# Patient Record
Sex: Male | Born: 2000
Health system: Southern US, Community
[De-identification: ages and names within clinical notes are randomized; demographics above are authoritative.]

## PROBLEM LIST (undated history)

## (undated) DIAGNOSIS — E119 Type 2 diabetes mellitus without complications: Secondary | ICD-10-CM

## (undated) HISTORY — DX: Type 2 diabetes mellitus without complications: E11.9

---
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
## 2020-04-09 ENCOUNTER — Other Ambulatory Visit: Payer: Self-pay

## 2020-04-10 ENCOUNTER — Encounter: Payer: Self-pay | Admitting: Family

## 2020-04-10 ENCOUNTER — Ambulatory Visit (INDEPENDENT_AMBULATORY_CARE_PROVIDER_SITE_OTHER): Payer: No Typology Code available for payment source | Admitting: Family

## 2020-04-10 DIAGNOSIS — Z833 Family history of diabetes mellitus: Secondary | ICD-10-CM | POA: Diagnosis not present

## 2020-04-10 DIAGNOSIS — E669 Obesity, unspecified: Secondary | ICD-10-CM

## 2020-04-10 DIAGNOSIS — Z0000 Encounter for general adult medical examination without abnormal findings: Secondary | ICD-10-CM | POA: Diagnosis not present

## 2020-04-10 DIAGNOSIS — Z23 Encounter for immunization: Secondary | ICD-10-CM

## 2020-04-10 LAB — CBC WITH DIFFERENTIAL/PLATELET
Basophils Absolute: 0 10*3/uL (ref 0.0–0.1)
Basophils Relative: 0.4 % (ref 0.0–3.0)
Eosinophils Absolute: 0.1 10*3/uL (ref 0.0–0.7)
Eosinophils Relative: 1 % (ref 0.0–5.0)
HCT: 44.5 % (ref 36.0–49.0)
Hemoglobin: 15.5 g/dL (ref 12.0–16.0)
Lymphocytes Relative: 23.9 % — ABNORMAL LOW (ref 24.0–48.0)
Lymphs Abs: 2.3 10*3/uL (ref 0.7–4.0)
MCHC: 34.8 g/dL (ref 31.0–37.0)
MCV: 89.2 fl (ref 78.0–98.0)
Monocytes Absolute: 0.7 10*3/uL (ref 0.1–1.0)
Monocytes Relative: 7.6 % (ref 3.0–12.0)
Neutro Abs: 6.5 10*3/uL (ref 1.4–7.7)
Neutrophils Relative %: 67.1 % (ref 43.0–71.0)
Platelets: 302 10*3/uL (ref 150.0–575.0)
RBC: 4.99 Mil/uL (ref 3.80–5.70)
RDW: 13.9 % (ref 11.4–15.5)
WBC: 9.6 10*3/uL (ref 4.5–13.5)

## 2020-04-10 LAB — POCT URINALYSIS DIPSTICK
Bilirubin, UA: NEGATIVE
Blood, UA: POSITIVE
Glucose, UA: NEGATIVE
Ketones, UA: NEGATIVE
Leukocytes, UA: NEGATIVE
Nitrite, UA: NEGATIVE
Protein, UA: POSITIVE — AB
Spec Grav, UA: 1.025 (ref 1.010–1.025)
Urobilinogen, UA: 0.2 E.U./dL
pH, UA: 6 (ref 5.0–8.0)

## 2020-04-10 NOTE — Progress Notes (Signed)
Established Patient Office Visit  Subjective:  Patient ID: Paul Gibson, male    DOB: Mar 22, 2001  Age: 19 y.o. MRN: [id number]  CC: No chief complaint on file.   HPI Paul Gibson presents for male wellness exam. Denies any concerns. He works for a [employer]. Not sexually active. Has a family history of obesity and DM. COVID vaccine received. Does not routinely exercise  Past Medical History:  Diagnosis Date  . Diabetes mellitus without complication [facility])    mother    History reviewed. No pertinent surgical history.  Family History  Problem Relation Age of Onset  . Diabetes Mother   . Cancer Father        skin cancer  . Diabetes Brother   . Diabetes Maternal Grandmother   . Diabetes Maternal Grandfather     Social History   Socioeconomic History  . Marital status: Single    Spouse name: Not on file  . Number of children: Not on file  . Years of education: Not on file  . Highest education level: Not on file  Occupational History  . Not on file  Tobacco Use  . Smoking status: Never Smoker  . Smokeless tobacco: Never Used  Vaping Use  . Vaping Use: Never used  Substance and Sexual Activity  . Alcohol use: Never  . Drug use: Never  . Sexual activity: Never  Other Topics Concern  . Not on file  Social History Narrative  . Not on file   Social Determinants of Health   Financial Resource Strain:   . Difficulty of Paying Living Expenses: Not on file  Food Insecurity:   . Worried About Programme researcher, broadcasting/film/video in the Last Year: Not on file  . Ran Out of Food in the Last Year: Not on file  Transportation Needs:   . Lack of Transportation (Medical): Not on file  . Lack of Transportation (Non-Medical): Not on file  Physical Activity:   . Days of Exercise per Week: Not on file  . Minutes of Exercise per Session: Not on file  Stress:   . Feeling of Stress : Not on file  Social Connections:   . Frequency of Communication with Friends and Family: Not on  file  . Frequency of Social Gatherings with Friends and Family: Not on file  . Attends Religious Services: Not on file  . Active Member of Clubs or Organizations: Not on file  . Attends Banker Meetings: Not on file  . Marital Status: Not on file  Intimate Partner Violence:   . Fear of Current or Ex-Partner: Not on file  . Emotionally Abused: Not on file  . Physically Abused: Not on file  . Sexually Abused: Not on file    Outpatient Medications Prior to Visit  Medication Sig Dispense Refill  . minocycline (MINOCIN) 50 MG capsule Take 50 mg by mouth 2 (two) times daily.    . montelukast (SINGULAIR) 10 MG tablet Take 1 tablet (10 mg total) by mouth at bedtime. 30 tablet 5   No facility-administered medications prior to visit.    Allergies  Allergen Reactions  . Other Cough    Cat and dog hair, dust.   . Pollen Extract Cough    ROS Review of Systems  All other systems reviewed and are negative.     Objective:    Physical Exam Vitals and nursing note reviewed.  Constitutional:      Appearance: Normal appearance. He is obese.  HENT:  Head: Normocephalic and atraumatic.     Right Ear: Tympanic membrane, ear canal and external ear normal.     Left Ear: Tympanic membrane, ear canal and external ear normal.     Nose: Nose normal.     Mouth/Throat:     Mouth: Mucous membranes are moist.     Pharynx: Oropharynx is clear.  Eyes:     Extraocular Movements: Extraocular movements intact.     Conjunctiva/sclera: Conjunctivae normal.     Pupils: Pupils are equal, round, and reactive to light.  Cardiovascular:     Rate and Rhythm: Normal rate and regular rhythm.     Pulses: Normal pulses.     Heart sounds: Normal heart sounds.  Pulmonary:     Effort: Pulmonary effort is normal.     Breath sounds: Normal breath sounds.  Abdominal:     General: Abdomen is flat. Bowel sounds are normal. There is no distension.     Palpations: Abdomen is soft.  Genitourinary:     Penis: Normal.      Testes: Normal.  Musculoskeletal:        General: Normal range of motion.     Cervical back: Normal range of motion and neck supple. No tenderness.  Skin:    General: Skin is warm and dry.  Neurological:     General: No focal deficit present.     Mental Status: He is alert and oriented to person, place, and time.  Psychiatric:        Mood and Affect: Mood normal.        Behavior: Behavior normal.     There were no vitals taken for this visit. Wt Readings from Last 3 Encounters:  03/07/19 212 lb 3.2 oz (96.3 kg) (97 %, Z= 1.84)*  10/20/18 195 lb 3.2 oz (88.5 kg) (94 %, Z= 1.52)*  10/08/17 164 lb 3.2 oz (74.5 kg) (81 %, Z= 0.89)*   * Growth percentiles are based on CDC (Boys, 2-20 Years) data.     Health Maintenance Due  Topic Date Due  . Hepatitis C Screening  Never done  . HIV Screening  Never done  . INFLUENZA VACCINE  12/25/2019    There are no preventive care reminders to display for this patient.  No results found for: TSH No results found for: WBC, HGB, HCT, MCV, PLT Lab Results  Component Value Date   NA 142 10/08/2017   K 4.6 10/08/2017   CO2 32 10/08/2017   GLUCOSE 84 10/08/2017   BUN 13 10/08/2017   CREATININE 0.81 10/08/2017   BILITOT 0.9 (H) 10/08/2017   ALKPHOS 78 10/08/2017   AST 15 10/08/2017   ALT 20 10/08/2017   PROT 7.3 10/08/2017   ALBUMIN 4.4 10/08/2017   CALCIUM 9.3 10/08/2017   GFR 134.03 10/08/2017   Lab Results  Component Value Date   CHOL 139 10/08/2017   Lab Results  Component Value Date   HDL 44.70 10/08/2017   Lab Results  Component Value Date   LDLCALC 80 10/08/2017   Lab Results  Component Value Date   TRIG 70.0 10/08/2017   Lab Results  Component Value Date   CHOLHDL 3 10/08/2017   No results found for: HGBA1C    Assessment & Plan:   Problem List Items Addressed This Visit    None    Visit Diagnoses    Routine adult health maintenance    -  Primary   Relevant Orders   POCT Glucose  (CBG)   CBC  w/Diff   POCT urinalysis dipstick   Family history of diabetes mellitus (DM)       Relevant Orders   POCT Glucose (CBG)   Obesity (BMI 35.0-39.9 without comorbidity)       Relevant Orders   POCT Glucose (CBG)   CBC w/Diff   POCT urinalysis dipstick   Need for immunization against influenza       Need for immunization using diphtheria-tetanus-pertussis with typhoid-paratyphoid (DTP + TAB) vaccine          No orders of the defined types were placed in this encounter.   Follow-up: 1 year and sooner as needed   Eulis Foster, FNP

## 2020-04-10 NOTE — Patient Instructions (Signed)

## 2021-02-15 ENCOUNTER — Other Ambulatory Visit (HOSPITAL_COMMUNITY): Payer: Self-pay

## 2021-03-11 ENCOUNTER — Ambulatory Visit: Payer: No Typology Code available for payment source | Admitting: Family Medicine

## 2021-03-12 ENCOUNTER — Ambulatory Visit: Payer: No Typology Code available for payment source | Admitting: Allergy & Immunology

## 2021-05-10 ENCOUNTER — Emergency Department (HOSPITAL_COMMUNITY)
Admission: EM | Admit: 2021-05-10 | Discharge: 2021-05-11 | Disposition: A | Discharge status: Home / Self Care | Payer: Managed Care, Other (non HMO) | Attending: Emergency Medicine | Admitting: Emergency Medicine

## 2021-05-10 ENCOUNTER — Emergency Department (HOSPITAL_COMMUNITY): Payer: Managed Care, Other (non HMO)

## 2021-05-10 DIAGNOSIS — Y92410 Unspecified street and highway as the place of occurrence of the external cause: Secondary | ICD-10-CM | POA: Diagnosis not present

## 2021-05-10 DIAGNOSIS — S52032A Displaced fracture of olecranon process with intraarticular extension of left ulna, initial encounter for closed fracture: Secondary | ICD-10-CM | POA: Insufficient documentation

## 2021-05-10 DIAGNOSIS — R079 Chest pain, unspecified: Secondary | ICD-10-CM | POA: Diagnosis not present

## 2021-05-10 DIAGNOSIS — E119 Type 2 diabetes mellitus without complications: Secondary | ICD-10-CM | POA: Diagnosis not present

## 2021-05-10 DIAGNOSIS — S0081XA Abrasion of other part of head, initial encounter: Secondary | ICD-10-CM | POA: Diagnosis not present

## 2021-05-10 DIAGNOSIS — M25522 Pain in left elbow: Secondary | ICD-10-CM | POA: Diagnosis not present

## 2021-05-10 DIAGNOSIS — S42402A Unspecified fracture of lower end of left humerus, initial encounter for closed fracture: Secondary | ICD-10-CM

## 2021-05-10 DIAGNOSIS — S80212A Abrasion, left knee, initial encounter: Secondary | ICD-10-CM | POA: Diagnosis not present

## 2021-05-10 DIAGNOSIS — Q632 Ectopic kidney: Secondary | ICD-10-CM | POA: Diagnosis not present

## 2021-05-10 DIAGNOSIS — Y906 Blood alcohol level of 120-199 mg/100 ml: Secondary | ICD-10-CM | POA: Diagnosis not present

## 2021-05-10 DIAGNOSIS — R911 Solitary pulmonary nodule: Secondary | ICD-10-CM | POA: Diagnosis not present

## 2021-05-10 DIAGNOSIS — R4182 Altered mental status, unspecified: Secondary | ICD-10-CM | POA: Diagnosis not present

## 2021-05-10 DIAGNOSIS — S59902A Unspecified injury of left elbow, initial encounter: Secondary | ICD-10-CM | POA: Diagnosis present

## 2021-05-10 LAB — CBC WITH DIFFERENTIAL/PLATELET
Abs Immature Granulocytes: 0.1 10*3/uL — ABNORMAL HIGH (ref 0.00–0.07)
Basophils Absolute: 0 10*3/uL (ref 0.0–0.1)
Basophils Relative: 0 %
Eosinophils Absolute: 0.2 10*3/uL (ref 0.0–0.5)
Eosinophils Relative: 2 %
HCT: 45.7 % (ref 39.0–52.0)
Hemoglobin: 15.7 g/dL (ref 13.0–17.0)
Immature Granulocytes: 1 %
Lymphocytes Relative: 37 %
Lymphs Abs: 3.3 10*3/uL (ref 0.7–4.0)
MCH: 30.8 pg (ref 26.0–34.0)
MCHC: 34.4 g/dL (ref 30.0–36.0)
MCV: 89.8 fL (ref 80.0–100.0)
Monocytes Absolute: 0.7 10*3/uL (ref 0.1–1.0)
Monocytes Relative: 8 %
Neutro Abs: 4.8 10*3/uL (ref 1.7–7.7)
Neutrophils Relative %: 52 %
Platelets: 310 10*3/uL (ref 150–400)
RBC: 5.09 MIL/uL (ref 4.22–5.81)
RDW: 13.1 % (ref 11.5–15.5)
WBC: 9.1 10*3/uL (ref 4.0–10.5)
nRBC: 0 % (ref 0.0–0.2)

## 2021-05-10 LAB — COMPREHENSIVE METABOLIC PANEL
ALT: 70 U/L — ABNORMAL HIGH (ref 0–44)
AST: 81 U/L — ABNORMAL HIGH (ref 15–41)
Albumin: 3.5 g/dL (ref 3.5–5.0)
Alkaline Phosphatase: 69 U/L (ref 38–126)
Anion gap: 10 (ref 5–15)
BUN: 11 mg/dL (ref 6–20)
CO2: 21 mmol/L — ABNORMAL LOW (ref 22–32)
Calcium: 7.9 mg/dL — ABNORMAL LOW (ref 8.9–10.3)
Chloride: 103 mmol/L (ref 98–111)
Creatinine, Ser: 0.86 mg/dL (ref 0.61–1.24)
GFR, Estimated: >60 mL/min (ref >60)
Glucose, Bld: 162 mg/dL — ABNORMAL HIGH (ref 70–99)
Potassium: 3.8 mmol/L (ref 3.5–5.1)
Sodium: 134 mmol/L — ABNORMAL LOW (ref 135–145)
Total Bilirubin: 1.1 mg/dL (ref 0.3–1.2)
Total Protein: 6.5 g/dL (ref 6.5–8.1)

## 2021-05-10 LAB — ETHANOL: Alcohol, Ethyl (B): 150 mg/dL — ABNORMAL HIGH (ref <10)

## 2021-05-10 MED ORDER — FENTANYL CITRATE PF 50 MCG/ML IJ SOSY
25.0000 ug | PREFILLED_SYRINGE | Freq: Once | INTRAMUSCULAR | Status: AC
  Administered 2021-05-11: 25 ug via INTRAVENOUS
  Filled 2021-05-10: qty 1
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
## 2021-05-11 ENCOUNTER — Emergency Department (HOSPITAL_COMMUNITY): Payer: Managed Care, Other (non HMO)

## 2021-05-11 ENCOUNTER — Other Ambulatory Visit: Payer: Self-pay

## 2021-05-11 DIAGNOSIS — S52032A Displaced fracture of olecranon process with intraarticular extension of left ulna, initial encounter for closed fracture: Secondary | ICD-10-CM | POA: Diagnosis not present

## 2021-05-11 MED ORDER — IOHEXOL 300 MG/ML  SOLN
100.0000 mL | Freq: Once | INTRAMUSCULAR | Status: AC | PRN
  Administered 2021-05-11: 100 mL via INTRAVENOUS

## 2021-05-11 MED ORDER — BACITRACIN ZINC 500 UNIT/GM EX OINT
TOPICAL_OINTMENT | Freq: Two times a day (BID) | CUTANEOUS | Status: DC
  Filled 2021-05-11: qty 0.9
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
## 2021-05-11 NOTE — ED Notes (Signed)
Pt was placed on O2 d/t pt's SpO2 decreasing to upper 80's while sleeping in the bed. Pain meds held d/t pt sleeping and in no obvious distress.

## 2021-05-11 NOTE — Progress Notes (Signed)
Orthopedic Tech Progress Note Patient Details:  Paul Gibson [birth year] [id number]  Ortho Devices Type of Ortho Device: Post (long arm) splint Ortho Device/Splint Location: LUE Ortho Device/Splint Interventions: Ordered, Application, Adjustment   Post Interventions Patient Tolerated: Fair Instructions Provided: Care of device, Poper ambulation with device  Paul Gibson 05/11/2021, 2:45 AM

## 2021-05-11 NOTE — Discharge Instructions (Addendum)
Your CT scan showed a spot in your lung.  You will need to follow-up with your primary care doctor to continue to monitor this.  Call to make an appointment within the next several weeks.  Follow-up with orthopedic surgery within 1 to 2 weeks.  Return back to the ER if you have fevers worsening symptoms or any additional concerns.

## 2022-07-27 ENCOUNTER — Emergency Department: Admit: 2022-07-27

## 2022-07-27 ENCOUNTER — Inpatient Hospital Stay
Admit: 2022-07-27 | Discharge: 2022-07-27 | Disposition: A | Discharge status: Home / Self Care | Attending: Emergency Medicine

## 2022-07-27 DIAGNOSIS — N10 Acute tubulo-interstitial nephritis: Secondary | ICD-10-CM

## 2022-07-27 DIAGNOSIS — R112 Nausea with vomiting, unspecified: Secondary | ICD-10-CM

## 2022-07-27 LAB — COMPREHENSIVE METABOLIC PANEL
ALT: 60 U/L (ref 12–78)
Albumin/Globulin Ratio: 1 — ABNORMAL LOW (ref 1.1–2.2)
Albumin: 4 g/dL (ref 3.5–5.0)
Alk Phosphatase: 71 U/L (ref 45–117)
Anion Gap: 10 mmol/L (ref 5–15)
BUN: 13 MG/DL (ref 6–20)
Bun/Cre Ratio: 11 — ABNORMAL LOW (ref 12–20)
CO2: 21 mmol/L (ref 21–32)
Calcium: 9.3 MG/DL (ref 8.5–10.1)
Chloride: 105 mmol/L (ref 97–108)
Creatinine: 1.19 MG/DL (ref 0.70–1.30)
Est, Glom Filt Rate: >60 mL/min/{1.73_m2} (ref >60)
Globulin: 4.2 g/dL — ABNORMAL HIGH (ref 2.0–4.0)
Glucose: 186 mg/dL — ABNORMAL HIGH (ref 65–100)
Sodium: 136 mmol/L (ref 136–145)
Total Bilirubin: 0.9 MG/DL (ref 0.2–1.0)
Total Protein: 8.2 g/dL (ref 6.4–8.2)

## 2022-07-27 LAB — CBC WITH AUTO DIFFERENTIAL
Absolute Immature Granulocyte: 0.1 10*3/uL — ABNORMAL HIGH (ref 0.00–0.04)
Basophils %: 1 % (ref 0–1)
Basophils Absolute: 0.1 10*3/uL (ref 0.0–0.1)
Eosinophils %: 1 % (ref 0–7)
Eosinophils Absolute: 0.1 10*3/uL (ref 0.0–0.4)
Hematocrit: 45.2 % (ref 36.6–50.3)
Hemoglobin: 16.3 g/dL (ref 12.1–17.0)
Immature Granulocytes: 1 % — ABNORMAL HIGH (ref 0.0–0.5)
Lymphocytes %: 22 % (ref 12–49)
Lymphocytes Absolute: 2.4 10*3/uL (ref 0.8–3.5)
MCH: 31.1 PG (ref 26.0–34.0)
MCHC: 36.1 g/dL (ref 30.0–36.5)
MCV: 86.3 FL (ref 80.0–99.0)
MPV: 10.7 FL (ref 8.9–12.9)
Monocytes %: 8 % (ref 5–13)
Monocytes Absolute: 0.9 10*3/uL (ref 0.0–1.0)
Neutrophils %: 67 % (ref 32–75)
Neutrophils Absolute: 7.5 10*3/uL (ref 1.8–8.0)
Nucleated RBCs: 0 PER 100 WBC
Platelets: 416 10*3/uL — ABNORMAL HIGH (ref 150–400)
RBC: 5.24 M/uL (ref 4.10–5.70)
RDW: 13.2 % (ref 11.5–14.5)
WBC: 11 10*3/uL (ref 4.1–11.1)
nRBC: 0 10*3/uL (ref 0.00–0.01)

## 2022-07-27 LAB — URINALYSIS WITH MICROSCOPIC
Bilirubin Urine: NEGATIVE
Glucose, UA: NEGATIVE mg/dL
Ketones, Urine: 40 mg/dL — AB
Nitrite, Urine: NEGATIVE
Protein, UA: 100 mg/dL — AB
Specific Gravity, UA: 1.021 (ref 1.003–1.030)
Urobilinogen, Urine: 1 EU/dL (ref 0.2–1.0)
pH, Urine: 7.5 (ref 5.0–8.0)

## 2022-07-27 LAB — POTASSIUM: Potassium: 3.1 mmol/L — ABNORMAL LOW (ref 3.5–5.1)

## 2022-07-27 LAB — LIPASE: Lipase: 45 U/L (ref 13–75)

## 2022-07-27 LAB — EXTRA TUBES HOLD

## 2022-07-27 MED ORDER — DROPERIDOL 2.5 MG/ML IJ SOLN
2.5 | Freq: Once | INTRAMUSCULAR | Status: AC
Start: 2022-07-27 — End: 2022-07-27
  Administered 2022-07-27: 10:00:00 1.25 mg via INTRAVENOUS

## 2022-07-27 MED ORDER — MORPHINE SULFATE (PF) 2 MG/ML IV SOLN
2 | Freq: Once | INTRAVENOUS | Status: AC
Start: 2022-07-27 — End: 2022-07-27
  Administered 2022-07-27: 08:00:00 2 mg via INTRAVENOUS

## 2022-07-27 MED ORDER — IBUPROFEN 600 MG PO TABS
600 | ORAL_TABLET | Freq: Four times a day (QID) | ORAL | 0 refills | Status: AC | PRN
Start: 2022-07-27 — End: 2022-08-03

## 2022-07-27 MED ORDER — ACETAMINOPHEN 500 MG PO TABS
500 | ORAL_TABLET | Freq: Three times a day (TID) | ORAL | 3 refills | Status: AC
Start: 2022-07-27 — End: ?

## 2022-07-27 MED ORDER — KETOROLAC TROMETHAMINE 15 MG/ML IJ SOLN
15 | Freq: Once | INTRAMUSCULAR | Status: AC
Start: 2022-07-27 — End: 2022-07-27
  Administered 2022-07-27: 08:00:00 15 mg via INTRAVENOUS

## 2022-07-27 MED ORDER — ONDANSETRON HCL 4 MG/2ML IJ SOLN
4 | Freq: Once | INTRAMUSCULAR | Status: AC
Start: 2022-07-27 — End: 2022-07-27
  Administered 2022-07-27: 08:00:00 4 mg via INTRAVENOUS

## 2022-07-27 MED ORDER — CEFTRIAXONE SODIUM 1 G IJ SOLR
1 | INTRAMUSCULAR | Status: AC
Start: 2022-07-27 — End: 2022-07-27
  Administered 2022-07-27: 12:00:00 1000 mg via INTRAVENOUS

## 2022-07-27 MED ORDER — ONDANSETRON 4 MG PO TBDP
4 | ORAL_TABLET | Freq: Three times a day (TID) | ORAL | 0 refills | Status: AC | PRN
Start: 2022-07-27 — End: ?

## 2022-07-27 MED ORDER — SODIUM CHLORIDE 0.9 % IV BOLUS
0.9 | Freq: Once | INTRAVENOUS | Status: AC
Start: 2022-07-27 — End: 2022-07-27
  Administered 2022-07-27: 08:00:00 1000 mL via INTRAVENOUS

## 2022-07-27 MED ORDER — CEPHALEXIN 500 MG PO CAPS
500 MG | ORAL_CAPSULE | Freq: Two times a day (BID) | ORAL | 0 refills | Status: AC
Start: 2022-07-27 — End: 2022-08-03

## 2022-07-27 MED ORDER — KETOROLAC TROMETHAMINE 15 MG/ML IJ SOLN
15 | Freq: Once | INTRAMUSCULAR | Status: AC
Start: 2022-07-27 — End: 2022-07-27
  Administered 2022-07-27: 11:00:00 15 mg via INTRAVENOUS

## 2022-07-27 MED FILL — SODIUM CHLORIDE 0.9 % IV SOLN: 0.9 % | INTRAVENOUS | Qty: 1000

## 2022-07-27 MED FILL — ONDANSETRON HCL 4 MG/2ML IJ SOLN: 4 MG/2ML | INTRAMUSCULAR | Qty: 2

## 2022-07-27 MED FILL — DROPERIDOL 2.5 MG/ML IJ SOLN: 2.5 MG/ML | INTRAMUSCULAR | Qty: 2

## 2022-07-27 MED FILL — CEFTRIAXONE SODIUM 1 G IJ SOLR: 1 g | INTRAMUSCULAR | Qty: 1000

## 2022-07-27 MED FILL — KETOROLAC TROMETHAMINE 15 MG/ML IJ SOLN: 15 MG/ML | INTRAMUSCULAR | Qty: 1

## 2022-07-27 MED FILL — MORPHINE SULFATE 2 MG/ML IJ SOLN: 2 mg/mL | INTRAMUSCULAR | Qty: 1

## 2022-07-27 NOTE — ED Provider Notes (Signed)
[facility] EMERGENCY DEPT  EMERGENCY DEPARTMENT ENCOUNTER      Pt Name: Tommy Anderson  MRN: [id number]  [place] 03/01/2001  Date of evaluation: 07/27/2022  Provider: Isaac Laud, MD    CHIEF COMPLAINT       Chief Complaint   Patient presents with    Abdominal Pain       EMERGENCY DEPARTMENT COURSE and DIFFERENTIAL DIAGNOSIS/MDM:   Medical Decision Making  22 year old male presenting ER with abdominal pain that started this evening.  Patient reports generalized abdominal pain however pain seems to be more prominent in the left upper quadrant and periumbilical region.  Reports pain in his right shoulder.  Denies any pain after eating.  No history of abdominal surgeries.  Associated with some nausea but no vomiting.  Denies any diarrhea constipation.  Denies any urinary symptoms.  Patient reports pain coming and going.  Patient is diaphoretic.  No pain in the right upper quadrant negative Murphy sign no McBurney's point tenderness.  No guarding or rigidity.  Intermittent reports of pain rating to the back.    No recent illnesses or viral URI.  Patient denies any significant past medical history.  Ordered labs and electrolytes IV fluids antiemetics pain medications CAT scan abdomen pelvis.  No leukocytosis, normal electrolytes LFTs and lipase.  Patient still having nausea pain-free.  After fluids antiemetics patient's symptoms improved after obtaining urinalysis signs concerning for urinary tract infection.  This in light of patient's symptoms left upper quadrant/left flank pain nausea vomiting concern for pyonephritis.  Will send urine culture given IV dose of Rocephin and will continue patient on outpatient antibiotics as well as antiemetics and medications.  Patient stable for discharge    Amount and/or Complexity of Data Reviewed  Labs: ordered. Decision-making details documented in ED Course.  Radiology: ordered and independent interpretation performed. Decision-making details documented in ED Course.    Risk  OTC  drugs.  Prescription drug management.            REASSESSMENT     ED Course as of 07/27/22 2050   Sun Jul 27, 2022   0635 Bacteria, UA(!): 4+ [ZD]   0635 WBC, UA: 50-100 [ZD]   0635 Leukocyte Esterase, Urine(!): MODERATE [ZD]      ED Course User Index  [ZD] Isaac Laud, MD         HISTORY OF PRESENT ILLNESS    Patient ambulatory to Triage with c/o abdominal pain that started around midnight.      Patient denies any nausea, vomiting, diarrhea, or dysuria.         Nursing Notes were reviewed.    REVIEW OF SYSTEMS       Review of Systems      PAST MEDICAL HISTORY   No past medical history on file.      SURGICAL HISTORY     No past surgical history on file.      CURRENT MEDICATIONS       Discharge Medication List as of 07/27/2022  6:37 AM          ALLERGIES     Patient has no known allergies.    FAMILY HISTORY     No family history on file.       SOCIAL HISTORY       Social History     Socioeconomic History    Marital status: Single       SCREENINGS         Glasgow Coma Scale  Eye Opening: Spontaneous  Best Verbal Response: Oriented  Best Motor Response: Obeys commands  Glasgow Coma Scale Score: 15                     CIWA Assessment  BP: 101/62  Pulse: 63                 PHYSICAL EXAM       Vitals:    07/27/22 0327 07/27/22 0330 07/27/22 0400 07/27/22 0445   BP:  91/68 (!) 102/58 101/62   Pulse: 89   63   Resp:    13   Temp: 97.3 F (36.3 C)      TempSrc: Oral      SpO2:  98%  100%   Weight:       Height:           Body mass index is 35.12 kg/m.    Physical Exam  Vitals and nursing note reviewed.   Constitutional:       General: He is not in acute distress.     Appearance: Normal appearance. He is diaphoretic.   HENT:      Head: Normocephalic.      Mouth/Throat:      Pharynx: Oropharynx is clear.   Eyes:      Conjunctiva/sclera: Conjunctivae normal.   Cardiovascular:      Rate and Rhythm: Normal rate.   Pulmonary:      Effort: Pulmonary effort is normal. No respiratory distress.   Abdominal:      General: Abdomen  is flat.      Palpations: Abdomen is soft.      Tenderness: There is abdominal tenderness in the periumbilical area and left upper quadrant. There is left CVA tenderness. There is no right CVA tenderness, guarding or rebound. Negative signs include Murphy's sign and McBurney's sign.   Musculoskeletal:         General: Normal range of motion.      Cervical back: Neck supple.   Skin:     General: Skin is warm.      Capillary Refill: Capillary refill takes less than 2 seconds.      Findings: No rash.   Neurological:      General: No focal deficit present.      Mental Status: He is alert and oriented to person, place, and time.         DIAGNOSTIC RESULTS     RADIOLOGY:   Interpretation per the Radiologist below, if available at the time of this note:    CT ABDOMEN PELVIS WO CONTRAST Additional Contrast? 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           LABS:    Results for orders placed or performed during the hospital encounter of 07/27/22   CBC with Auto Differential   Result Value Ref Range    WBC 11.0 4.1 - 11.1 K/uL    RBC 5.24 4.10 - 5.70 M/uL    Hemoglobin 16.3 12.1 - 17.0 g/dL    Hematocrit 45.2 36.6 - 50.3 %    MCV 86.3 80.0 - 99.0 FL    MCH 31.1 26.0 - 34.0 PG    MCHC 36.1 30.0 - 36.5 g/dL    RDW 13.2 11.5 - 14.5 %    Platelets 416 (H) 150 - 400 K/uL    MPV 10.7 8.9 - 12.9 FL    Nucleated RBCs 0.0 0 PER 100 WBC    nRBC 0.00  0.00 - 0.01 K/uL    Neutrophils % 67 32 - 75 %    Lymphocytes % 22 12 - 49 %    Monocytes % 8 5 - 13 %    Eosinophils % 1 0 - 7 %    Basophils % 1 0 - 1 %    Immature Granulocytes 1 (H) 0.0 - 0.5 %    Neutrophils Absolute 7.5 1.8 - 8.0 K/UL    Lymphocytes Absolute 2.4 0.8 - 3.5 K/UL    Monocytes Absolute 0.9 0.0 - 1.0 K/UL    Eosinophils Absolute 0.1 0.0 - 0.4 K/UL    Basophils Absolute 0.1 0.0 - 0.1 K/UL    Absolute Immature Granulocyte 0.1 (H) 0.00 - 0.04 K/UL    Differential Type AUTOMATED     CMP   Result Value Ref Range    Sodium 136 136 - 145 mmol/L    Potassium  Hemolyzed, Recollection Recommended 3.5 - 5.1 mmol/L    Chloride 105 97 - 108 mmol/L    CO2 21 21 - 32 mmol/L    Anion Gap 10 5 - 15 mmol/L    Glucose 186 (H) 65 - 100 mg/dL    BUN 13 6 - 20 MG/DL    Creatinine 1.19 0.70 - 1.30 MG/DL    Bun/Cre Ratio 11 (L) 12 - 20      Est, Glom Filt Rate >60 >60 ml/min/1.2m    Calcium 9.3 8.5 - 10.1 MG/DL    Total Bilirubin 0.9 0.2 - 1.0 MG/DL    ALT 60 12 - 78 U/L    AST Hemolyzed, Recollection Recommended 15 - 37 U/L    Alk Phosphatase 71 45 - 117 U/L    Total Protein 8.2 6.4 - 8.2 g/dL    Albumin 4.0 3.5 - 5.0 g/dL    Globulin 4.2 (H) 2.0 - 4.0 g/dL    Albumin/Globulin Ratio 1.0 (L) 1.1 - 2.2     Lipase   Result Value Ref Range    Lipase 45 13 - 75 U/L   Urinalysis with Microscopic   Result Value Ref Range    Color, UA YELLOW/STRAW      Appearance CLOUDY (A) CLEAR      Specific Gravity, UA 1.021 1.003 - 1.030      pH, Urine 7.5 5.0 - 8.0      Protein, UA 100 (A) NEG mg/dL    Glucose, UA Negative NEG mg/dL    Ketones, Urine 40 (A) NEG mg/dL    Bilirubin Urine Negative NEG      Blood, Urine TRACE (A) NEG      Urobilinogen, Urine 1.0 0.2 - 1.0 EU/dL    Nitrite, Urine Negative NEG      Leukocyte Esterase, Urine MODERATE (A) NEG      WBC, UA 50-100 0 - 4 /hpf    RBC, UA 0-5 0 - 5 /hpf    Epithelial Cells UA FEW FEW /lpf    BACTERIA, URINE 4+ (A) NEG /hpf    Hyaline Casts, UA 0-2 0 - 2 /lpf   Potassium   Result Value Ref Range    Potassium 3.1 (L) 3.5 - 5.1 mmol/L   Extra Tubes Hold   Result Value Ref Range    Specimen HOld UC     Comment:        Add-on orders for these samples will be processed based on acceptable specimen integrity and analyte stability, which may vary by analyte.  CONSULTS:  None    PROCEDURES:  Unless otherwise noted below, none     Procedures      FINAL IMPRESSION      1. Nausea and vomiting, unspecified vomiting type    2. Acute pyelonephritis          DISPOSITION/PLAN   DISPOSITION Decision To Discharge 07/27/2022 06:35:01 AM      PATIENT REFERRED  TO:  Your Family doctor    Call         DISCHARGE MEDICATIONS:  Discharge Medication List as of 07/27/2022  6:37 AM        START taking these medications    Details   ibuprofen (ADVIL;MOTRIN) 600 MG tablet Take 1 tablet by mouth every 6 hours as needed for Pain, Disp-28 tablet, R-0Print      acetaminophen (TYLENOL) 500 MG tablet Take 2 tablets by mouth in the morning, at noon, and at bedtime, Disp-120 tablet, R-3Print      ondansetron (ZOFRAN-ODT) 4 MG disintegrating tablet Take 1 tablet by mouth every 8 hours as needed for Nausea or Vomiting, Disp-30 tablet, R-0Print      cephALEXin (KEFLEX) 500 MG capsule Take 1 capsule by mouth 2 times daily for 7 days, Disp-14 capsule, R-0Print               (Please note that portions of this note were completed with a voice recognition program.  Efforts were made to edit the dictations but occasionally words are mis-transcribed.)    Isaac Laud, MD (electronically signed)  Emergency Attending Physician            Isaac Laud, MD  07/27/22 2050

## 2022-07-27 NOTE — ED Triage Notes (Addendum)
Patient ambulatory to Triage with c/o abdominal pain that started around midnight.     Patient denies any nausea, vomiting, diarrhea, or dysuria.

## 2022-07-27 NOTE — ED Notes (Signed)
Patient given discharge instructions per provider and verbalized understanding.

## 2022-07-27 NOTE — ED Notes (Signed)
Pt to CT at this time

## 2022-07-28 ENCOUNTER — Inpatient Hospital Stay
Admit: 2022-07-28 | Discharge: 2022-07-28 | Disposition: A | Discharge status: Home / Self Care | Attending: Emergency Medicine

## 2022-07-28 DIAGNOSIS — N12 Tubulo-interstitial nephritis, not specified as acute or chronic: Secondary | ICD-10-CM

## 2022-07-28 DIAGNOSIS — E876 Hypokalemia: Secondary | ICD-10-CM

## 2022-07-28 LAB — CULTURE, URINE: Colony count: >100000

## 2022-07-28 LAB — CBC WITH AUTO DIFFERENTIAL
Absolute Immature Granulocyte: 0.1 10*3/uL — ABNORMAL HIGH (ref 0.00–0.04)
Basophils %: 0 % (ref 0–1)
Basophils Absolute: 0 10*3/uL (ref 0.0–0.1)
Eosinophils %: 1 % (ref 0–7)
Eosinophils Absolute: 0.2 10*3/uL (ref 0.0–0.4)
Hematocrit: 44.2 % (ref 36.6–50.3)
Hemoglobin: 15.6 g/dL (ref 12.1–17.0)
Immature Granulocytes: 0 % (ref 0.0–0.5)
Lymphocytes %: 20 % (ref 12–49)
Lymphocytes Absolute: 3.1 10*3/uL (ref 0.8–3.5)
MCH: 31.3 PG (ref 26.0–34.0)
MCHC: 35.3 g/dL (ref 30.0–36.5)
MCV: 88.6 FL (ref 80.0–99.0)
MPV: 9.8 FL (ref 8.9–12.9)
Monocytes %: 10 % (ref 5–13)
Monocytes Absolute: 1.6 10*3/uL — ABNORMAL HIGH (ref 0.0–1.0)
Neutrophils %: 69 % (ref 32–75)
Neutrophils Absolute: 10.7 10*3/uL — ABNORMAL HIGH (ref 1.8–8.0)
Nucleated RBCs: 0 PER 100 WBC
Platelets: 292 10*3/uL (ref 150–400)
RBC: 4.99 M/uL (ref 4.10–5.70)
RDW: 13.4 % (ref 11.5–14.5)
WBC: 15.7 10*3/uL — ABNORMAL HIGH (ref 4.1–11.1)
nRBC: 0 10*3/uL (ref 0.00–0.01)

## 2022-07-28 LAB — URINE DRUG SCREEN
Amphetamine, Urine: NEGATIVE
Barbiturates, Urine: NEGATIVE
Benzodiazepines, Urine: NEGATIVE
Cocaine, Urine: NEGATIVE
Methadone, Urine: NEGATIVE
Opiates, Urine: NEGATIVE
PCP, Urine: NEGATIVE
THC, TH-Cannabinol, Urine: NEGATIVE

## 2022-07-28 LAB — URINALYSIS WITH REFLEX TO CULTURE
BACTERIA, URINE: NEGATIVE /hpf
Bilirubin Urine: NEGATIVE
Glucose, UA: NEGATIVE mg/dL
Ketones, Urine: 80 mg/dL — AB
Leukocyte Esterase, Urine: NEGATIVE
Nitrite, Urine: NEGATIVE
Protein, UA: 100 mg/dL — AB
Specific Gravity, UA: 1.023 (ref 1.003–1.030)
Urobilinogen, Urine: 1 EU/dL (ref 0.2–1.0)
pH, Urine: 5.5 (ref 5.0–8.0)

## 2022-07-28 LAB — COMPREHENSIVE METABOLIC PANEL
ALT: 68 U/L (ref 12–78)
AST: 61 U/L — ABNORMAL HIGH (ref 15–37)
Albumin/Globulin Ratio: 1 — ABNORMAL LOW (ref 1.1–2.2)
Albumin: 3.9 g/dL (ref 3.5–5.0)
Alk Phosphatase: 73 U/L (ref 45–117)
Anion Gap: 6 mmol/L (ref 5–15)
BUN: 12 MG/DL (ref 6–20)
Bun/Cre Ratio: 15 (ref 12–20)
CO2: 27 mmol/L (ref 21–32)
Calcium: 9.1 MG/DL (ref 8.5–10.1)
Chloride: 104 mmol/L (ref 97–108)
Creatinine: 0.81 MG/DL (ref 0.70–1.30)
Est, Glom Filt Rate: >60 mL/min/{1.73_m2} (ref >60)
Globulin: 3.9 g/dL (ref 2.0–4.0)
Glucose: 160 mg/dL — ABNORMAL HIGH (ref 65–100)
Potassium: 3 mmol/L — ABNORMAL LOW (ref 3.5–5.1)
Sodium: 137 mmol/L (ref 136–145)
Total Bilirubin: 1.9 MG/DL — ABNORMAL HIGH (ref 0.2–1.0)
Total Protein: 7.8 g/dL (ref 6.4–8.2)

## 2022-07-28 MED ORDER — SODIUM CHLORIDE 0.9 % IV BOLUS
0.9 | Freq: Once | INTRAVENOUS | Status: AC
Start: 2022-07-28 — End: 2022-07-28
  Administered 2022-07-28: 16:00:00 1000 mL via INTRAVENOUS

## 2022-07-28 MED ORDER — CEFTRIAXONE SODIUM 1 G IJ SOLR
1 | INTRAMUSCULAR | Status: AC
Start: 2022-07-28 — End: 2022-07-28
  Administered 2022-07-28: 16:00:00 1000 mg via INTRAVENOUS

## 2022-07-28 MED ORDER — LACTATED RINGERS IV BOLUS
Freq: Once | INTRAVENOUS | Status: AC
Start: 2022-07-28 — End: 2022-07-28
  Administered 2022-07-28: 18:00:00 1000 mL via INTRAVENOUS

## 2022-07-28 MED ORDER — KETOROLAC TROMETHAMINE 15 MG/ML IJ SOLN
15 | Freq: Once | INTRAMUSCULAR | Status: AC
Start: 2022-07-28 — End: 2022-07-28
  Administered 2022-07-28: 16:00:00 15 mg via INTRAVENOUS

## 2022-07-28 MED ORDER — POTASSIUM CHLORIDE 10 MEQ/100ML IV SOLN
10 | Freq: Once | INTRAVENOUS | Status: AC
Start: 2022-07-28 — End: 2022-07-28
  Administered 2022-07-28: 18:00:00 10 meq via INTRAVENOUS

## 2022-07-28 MED ORDER — AMOXICILLIN 500 MG PO CAPS
500 | ORAL_CAPSULE | Freq: Two times a day (BID) | ORAL | 0 refills | Status: AC
Start: 2022-07-28 — End: 2022-08-04

## 2022-07-28 MED FILL — SODIUM CHLORIDE 0.9 % IV SOLN: 0.9 % | INTRAVENOUS | Qty: 1000

## 2022-07-28 MED FILL — KETOROLAC TROMETHAMINE 15 MG/ML IJ SOLN: 15 MG/ML | INTRAMUSCULAR | Qty: 1

## 2022-07-28 MED FILL — CEFTRIAXONE SODIUM 1 G IJ SOLR: 1 g | INTRAMUSCULAR | Qty: 1000

## 2022-07-28 MED FILL — LACTATED RINGERS IV SOLN: INTRAVENOUS | Qty: 1000

## 2022-07-28 MED FILL — POTASSIUM CHLORIDE 10 MEQ/100ML IV SOLN: 10 MEQ/0ML | INTRAVENOUS | Qty: 100

## 2022-07-28 NOTE — ED Provider Notes (Signed)
[facility] EMERGENCY DEPT  EMERGENCY DEPARTMENT ENCOUNTER      Pt Name: Tommy Anderson  MRN: [id number]  [place] Jul 28, 2000  Date of evaluation: 07/28/2022  Provider: Alton Revere, MD      HISTORY OF PRESENT ILLNESS      22 year old male who denies any pertinent medical history presents to the emergency department chief complaint of abdominal pain for the last several days.  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  He was discharged with antibiotic prescription, Zofran, Motrin, Tylenol prescriptions.  He tells me he picked up 1 prescription from the pharmacy and took it but does not know what it was.  He reports nausea but no vomiting.    The history is provided by the patient and medical records.           Nursing Notes were reviewed.    REVIEW OF SYSTEMS         Review of Systems        PAST MEDICAL HISTORY   No past medical history on file.      SURGICAL HISTORY     No past surgical history on file.      CURRENT MEDICATIONS       Previous Medications    ACETAMINOPHEN (TYLENOL) 500 MG TABLET    Take 2 tablets by mouth in the morning, at noon, and at bedtime    CEPHALEXIN (KEFLEX) 500 MG CAPSULE    Take 1 capsule by mouth 2 times daily for 7 days    IBUPROFEN (ADVIL;MOTRIN) 600 MG TABLET    Take 1 tablet by mouth every 6 hours as needed for Pain    ONDANSETRON (ZOFRAN-ODT) 4 MG DISINTEGRATING TABLET    Take 1 tablet by mouth every 8 hours as needed for Nausea or Vomiting       ALLERGIES     Patient has no known allergies.    FAMILY HISTORY     No family history on file.       SOCIAL HISTORY       Social History     Socioeconomic History    Marital status: Single         PHYSICAL EXAM       ED Triage Vitals [07/28/22 1023]   BP Temp Temp Source Pulse Respirations SpO2 Height Weight - Scale   109/71 98 F (36.7 C) Oral (!) 104 16 99 % 1.727 m ('5\' 8"'$ ) 104.8 kg (231 lb)       Body  mass index is 35.12 kg/m.    Physical Exam  Vitals and nursing note reviewed.   Constitutional:       General: He is not in acute distress.     Appearance: He is not ill-appearing.   Abdominal:      Palpations: Abdomen is soft.      Tenderness: There is generalized abdominal tenderness.   Neurological:      Mental Status: He is alert.             EMERGENCY DEPARTMENT COURSE and DIFFERENTIAL DIAGNOSIS/MDM:   Vitals:    Vitals:    07/28/22 1023   BP: 109/71   Pulse: (!) 104   Resp: 16   Temp: 98 F (36.7 C)   TempSrc: Oral   SpO2: 99%   Weight: 104.8 kg (231 lb)   Height: 1.727 m ('5\' 8"'$ )  Medical Decision Making  22 year old male presents to the emergency department above with ongoing abdominal pain in the setting of diagnosis of pyelonephritis yesterday.  His renal function is improved today with improvement in urine markers.  CT scan from yesterday reviewed which demonstrates horseshoe kidney, otherwise no concerning findings.  He is still appropriate for outpatient treat with antibiotics.  Will switch to amoxicillin given culture data.  Recommend follow-up with primary care, return if worsens.    Amount and/or Complexity of Data Reviewed  Labs: ordered.    Risk  Prescription drug management.            REASSESSMENT          CONSULTS:  None    PROCEDURES:     Procedures            (Please note that portions of this note were completed with a voice recognition program.  Efforts were made to edit the dictations but occasionally words are mis-transcribed.)    Alton Revere, MD (electronically signed)  Emergency Attending Physician              Alton Revere, MD  07/28/22 1420

## 2022-07-28 NOTE — ED Triage Notes (Addendum)
Pt arrives to the ER for complaints of mid abdominal pain that started on 3/2. Pt states that he was seen at the ER yesterday for the same pain.     Pt diaphoretic in triage.     Denies any vomiting, diarrhea, or fevers. Denies any changes in urination.
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
## 2023-07-21 ENCOUNTER — Ambulatory Visit: Admit: 2023-07-21 | Discharge: 2023-07-21 | Payer: PRIVATE HEALTH INSURANCE

## 2023-07-21 VITALS — BP 105/72 | HR 92 | Temp 98.60000°F | Resp 18 | Ht 67.0 in | Wt 230.0 lb

## 2023-07-21 DIAGNOSIS — R52 Pain, unspecified: Secondary | ICD-10-CM

## 2023-07-21 LAB — POCT COVID-19, ANTIGEN
Lot Number: 910216
SARS-COV-2, POC: NOT DETECTED

## 2023-07-21 LAB — POCT INFLUENZA A/B ANTIGEN
Inflenza A Ag: NEGATIVE
Influenza B Ag: NEGATIVE

## 2023-07-21 NOTE — Progress Notes (Addendum)
 07/21/2023   Tommy Anderson (DOB: 03-Oct-2000) is a 23 y.o. male, New patient, here for evaluation of the following chief complaint(s):  Fever (101. Pt c/o symptoms started yesterday morning.), Fatigue, Headache, Chills, and Generalized Body Aches       ASSESSMENT/PLAN:  Below is the assessment and plan developed based on review of pertinent history, physical exam, labs, studies, and medications.  1. Body aches  -     POCT COVID-19, Antigen  -     POCT Influenza A/B Antigen  2. Viral infection    -OTC symptom control    Handout given with care instructions  Pt with stable VS, non-toxic appearing  Pt in no acute distress and afebrile   4.   OTC for symptom management. Increase fluid intake, ensure adequate nutritional intake.  5.   Follow up with PCP as needed.  6.   Go to ED with development of any acute symptoms.     Follow up:  Return if symptoms worsen or fail to improve.  Follow up immediately for any new, worsening or changes or if symptoms are not improving over the next 5-7 days.     SUBJECTIVE/OBJECTIVE:    Fever   Associated symptoms include headaches. Pertinent negatives include no congestion, coughing, diarrhea, ear pain, nausea, sore throat or vomiting.   Fatigue  Associated symptoms: fatigue, fever and headaches    Associated symptoms: no congestion, no cough, no diarrhea, no ear pain, no nausea, no sore throat and no vomiting    Headache  Generalized Body Aches  Associated symptoms: fatigue, fever and headaches    Associated symptoms: no congestion, no cough, no diarrhea, no ear pain, no nausea, no sore throat and no vomiting           Fever (101. Pt c/o symptoms started yesterday morning.), Fatigue, Headache, Chills, and Generalized Body Aches        Results for orders placed or performed in visit on 07/21/23   POCT COVID-19, Antigen   Result Value Ref Range    SARS-COV-2, POC Not-Detected Not Detected    Lot Number [id number]     QC Pass/Fail pass     Performing Instrument BinaxNOW    POCT Influenza A/B  Antigen   Result Value Ref Range    Inflenza A Ag NEGATIVE     Influenza B Ag NEGATIVE            Review of Systems   Constitutional:  Positive for chills, fatigue and fever.   HENT:  Negative for congestion, ear pain, sinus pressure and sore throat.    Respiratory: Negative.  Negative for cough.    Cardiovascular: Negative.    Gastrointestinal:  Negative for diarrhea, nausea and vomiting.   Neurological:  Positive for headaches.       Asthma - no  COPD - no  Smoke - no  Vape - no      Physical Exam  Constitutional:       Appearance: Normal appearance. He is not toxic-appearing.   HENT:      Head: Normocephalic.      Right Ear: Ear canal and external ear normal. No middle ear effusion.      Left Ear: Ear canal and external ear normal.  No middle ear effusion.      Nose: No congestion or rhinorrhea.      Right Turbinates: Not enlarged.      Left Turbinates: Not enlarged.      Right Sinus: No maxillary sinus  tenderness or frontal sinus tenderness.      Left Sinus: No maxillary sinus tenderness or frontal sinus tenderness.      Mouth/Throat:      Mouth: Mucous membranes are moist.      Pharynx: Oropharynx is clear. No pharyngeal swelling, oropharyngeal exudate or posterior oropharyngeal erythema.   Cardiovascular:      Rate and Rhythm: Normal rate and regular rhythm.      Pulses: Normal pulses.      Heart sounds: Normal heart sounds.   Pulmonary:      Effort: Pulmonary effort is normal.      Breath sounds: Normal breath sounds. No wheezing, rhonchi or rales.   Abdominal:      General: Abdomen is flat. Bowel sounds are normal.      Palpations: Abdomen is soft.   Lymphadenopathy:      Cervical: No cervical adenopathy.   Neurological:      Mental Status: He is alert.        Vitals:    07/21/23 0944 07/21/23 1022   BP: 105/72    Site: Right Upper Arm    Position: Sitting    Cuff Size: Large Adult    Pulse: 92    Resp: 18    Temp: 98.6 F (37 C)    TempSrc: Oral    SpO2: 94% 96%   Weight: 104.3 kg (230 lb)    Height: 1.702  m (5\' 7" )         No Known Allergies    Current Outpatient Medications   Medication Sig Dispense Refill    acetaminophen (TYLENOL) 500 MG tablet Take 2 tablets by mouth in the morning, at noon, and at bedtime 120 tablet 3    ondansetron (ZOFRAN-ODT) 4 MG disintegrating tablet Take 1 tablet by mouth every 8 hours as needed for Nausea or Vomiting 30 tablet 0    ibuprofen (ADVIL;MOTRIN) 600 MG tablet Take 1 tablet by mouth every 6 hours as needed for Pain 28 tablet 0     No current facility-administered medications for this visit.        History reviewed. No pertinent past medical history.     History reviewed. No pertinent surgical history.     Social History:   Social Connections: Not on file        No care team member to display    There is no problem list on file for this patient.           I ADVISED PATIENT TO GO TO ER IF SYMPTOMS WORSEN , CHANGE OR FAILS TO IMPROVE.    I have discussed the diagnosis with the patient and the intended plan as seen in the above orders.  The patient has received an after-visit summary and questions were answered concerning future plans.  I have discussed medication side effects and warnings with the patient as well. The patient agrees and understands above plan.       An electronic signature was used to authenticate this note.  -- Payton Doughty, PA-C

## 2023-07-21 NOTE — Patient Instructions (Signed)
 Please follow up with your primary care provider within 2-5 days if your signs and symptoms have not resolved or worsened.     Please go immediately to the Emergency Department if you develop shortness of breath, chest pain and uncontrollable fever above 100.24F.     Nasal Congestion:  Flonase (over the counter) nasal spray, once a day  Saline irrigation kits help wash out sinuses 1-2 times a day  Normal saline nasal spray       Cough:  Throat lozenges, hot tea, and honey may help  Vicks VapoRub at night to help with cough and relieve muscles aches and pain  If not prescribed a cough medication, Delsym is an option.  It is an over the counter cough medication containing dextromethorphan to help suppress cough at night              If you have high blood pressure, take Coricidin HBP (or the generic form) instead.  Follow instructions on the box.     Congestion:  For thick mucus, take Mucinex (with Guafenesin only) to help thin the mucus.  Follow instructions on the box.  You will need to drink plenty of water with this medication.     Sore Throat:  Lozenges, as needed. Cepacol lozenges will help numb the throat  Chloraseptic spray also helps to numb throat pain  Salt water gargles to soothe throat pain     Sinus pain/pressure:  Warm, wet towel on face to help with facial sinus pain/pressure  Neti Pot or Saline Rinse can be helpful   decongestants as needed  Nasonex or Flonase or Nasacort         Afrin - no more than 2-3 days - tilt head to side and yawn to help with ear pressure   Advil or Tylenol-- you can alternate   cool mist humidifer       Headache/Pain Fever/Body Aches:  Alternate every 4 hours:    Tylenol up to 1000mg   with Ibuprofen up to 800mg     (Do not take Tylenol or Ibuprofen more frequently then every 8 hours)       Miscellanous:  Zyrtec/Xyzal/Allegra/Claritin during the day or Benadryl at night may help with allergies  Simple foods like chicken noodle soup, smoothies, hot tea with lemon and honey may  also help     Thank you for choosing [facility].  I hope you feel better soon!
